# Patient Record
Sex: Female | Born: 1945 | Race: White | Hispanic: No | Marital: Married | Smoking: Never smoker
Health system: Southern US, Community
[De-identification: ages and names within clinical notes are randomized; demographics above are authoritative.]

## PROBLEM LIST (undated history)

## (undated) DIAGNOSIS — E538 Deficiency of other specified B group vitamins: Secondary | ICD-10-CM

## (undated) DIAGNOSIS — E063 Autoimmune thyroiditis: Secondary | ICD-10-CM

## (undated) DIAGNOSIS — D649 Anemia, unspecified: Secondary | ICD-10-CM

## (undated) DIAGNOSIS — E039 Hypothyroidism, unspecified: Secondary | ICD-10-CM

## (undated) DIAGNOSIS — E785 Hyperlipidemia, unspecified: Secondary | ICD-10-CM

## (undated) DIAGNOSIS — E041 Nontoxic single thyroid nodule: Secondary | ICD-10-CM

## (undated) HISTORY — PX: PARTIAL HYSTERECTOMY: SHX80

## (undated) HISTORY — DX: Hypothyroidism, unspecified: E03.9

## (undated) HISTORY — DX: Deficiency of other specified B group vitamins: E53.8

## (undated) HISTORY — DX: Hyperlipidemia, unspecified: E78.5

## (undated) HISTORY — DX: Anemia, unspecified: D64.9

## (undated) HISTORY — DX: Autoimmune thyroiditis: E06.3

## (undated) HISTORY — PX: TONSILLECTOMY: SUR1361

## (undated) HISTORY — DX: Nontoxic single thyroid nodule: E04.1

---

## 2009-11-26 ENCOUNTER — Encounter: Admission: RE | Admit: 2009-11-26 | Discharge: 2009-11-26 | Payer: Self-pay

## 2009-11-26 ENCOUNTER — Encounter: Admission: RE | Admit: 2009-11-26 | Discharge: 2009-11-26 | Payer: Self-pay | Admitting: Family Medicine

## 2011-01-08 ENCOUNTER — Encounter: Payer: Self-pay | Admitting: Internal Medicine

## 2011-02-24 IMAGING — US US RENAL
1 series · 14 of 25 positions shown · non-contrast
Comparison: None.

CLINICAL DATA: Family history of renal cell cancer.

RENAL/URINARY TRACT ULTRASOUND COMPLETE

[Series 1: us renal · 0.26mm/px · 14 of 29 slices shown]
[im 1/29]
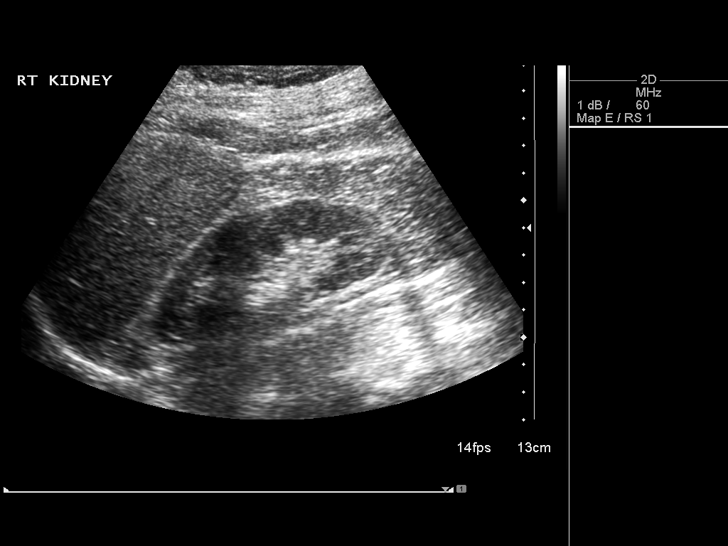
[im 3/29]
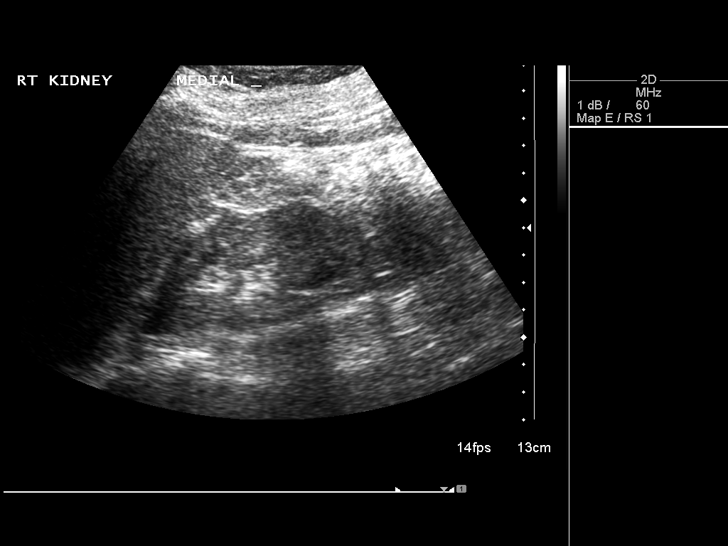
[im 5/29]
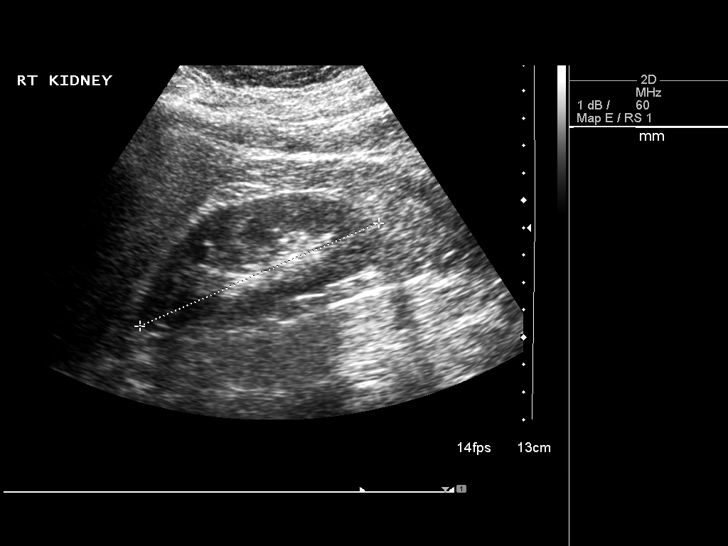
[im 8/29]
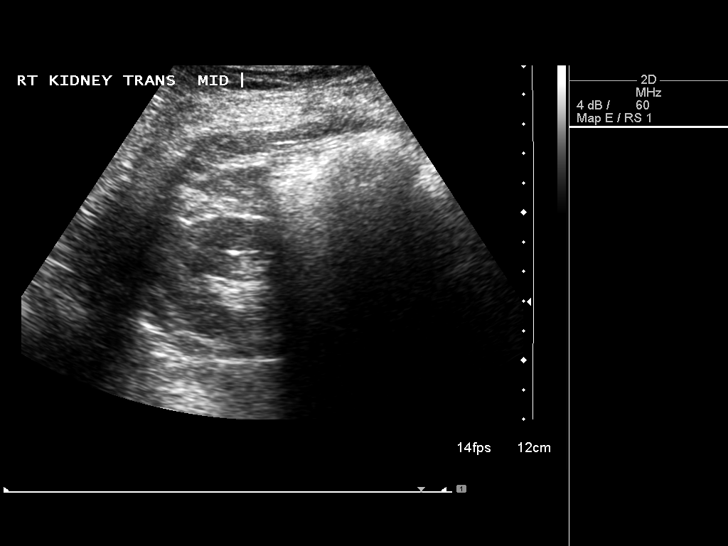
[im 10/29]
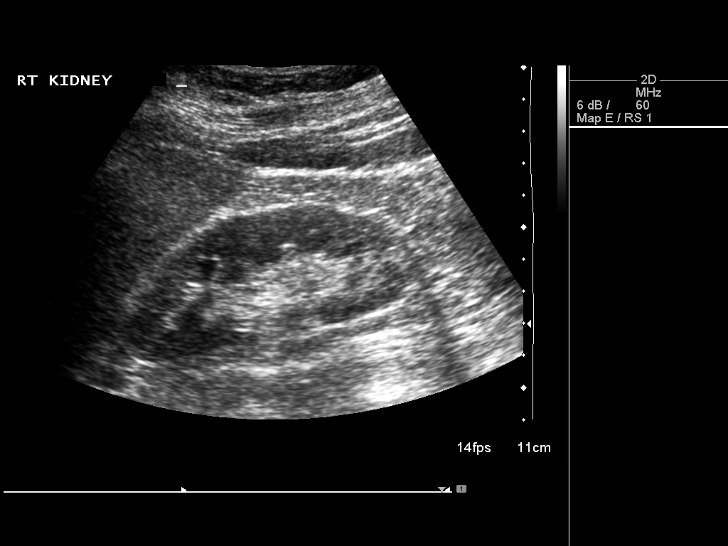
[im 11/29]
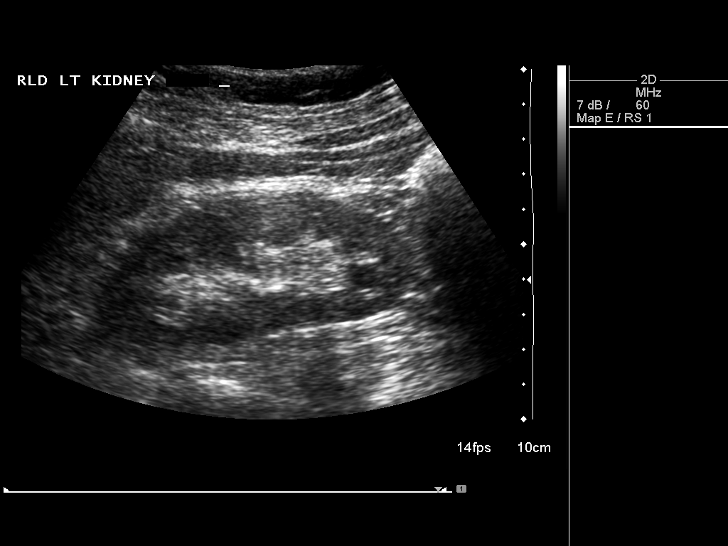
[im 13/29]
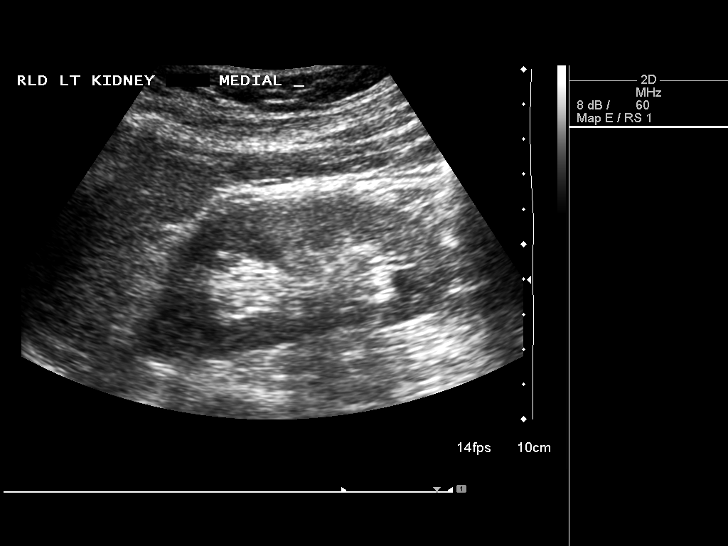
[im 16/29]
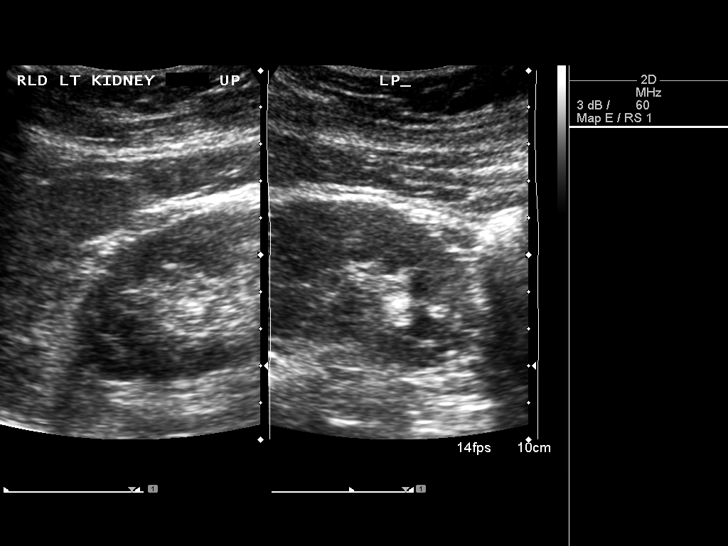
[im 18/29]
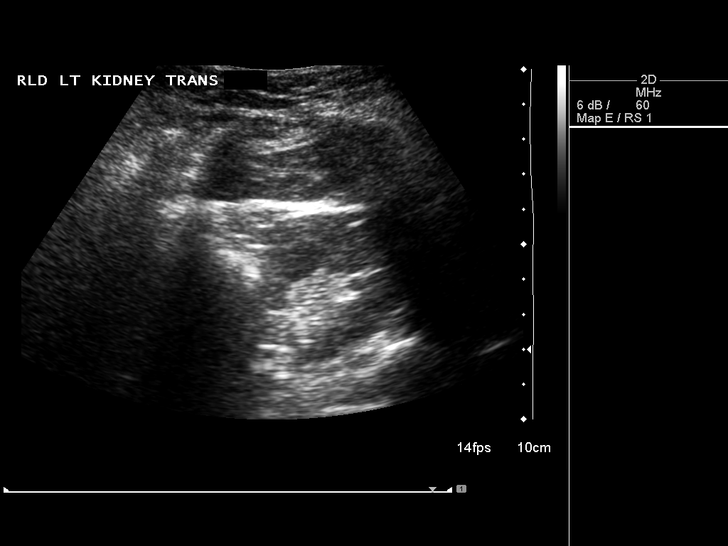
[im 19/29]
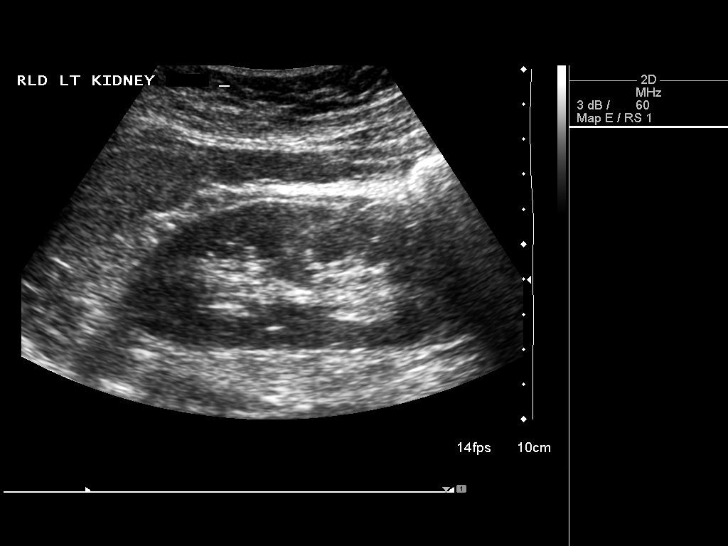
[im 22/29]
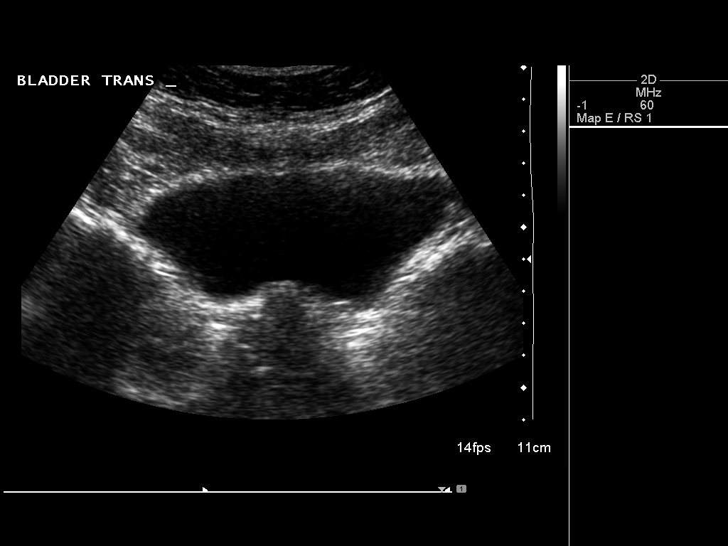
[im 24/29]
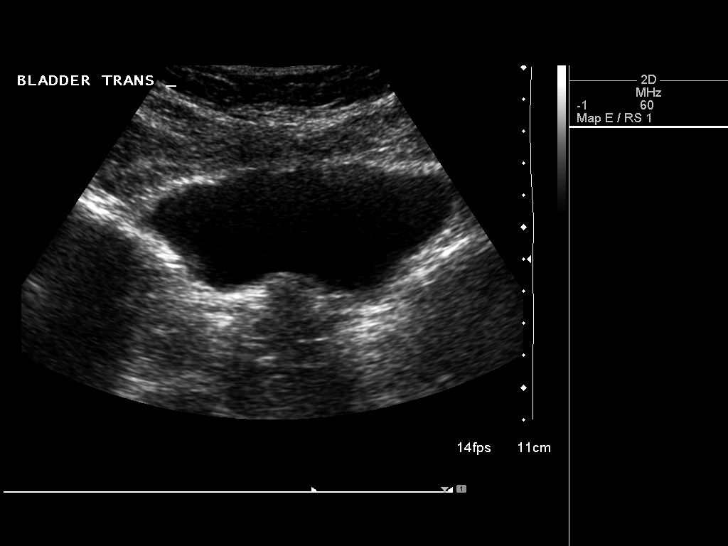
[im 26/29]
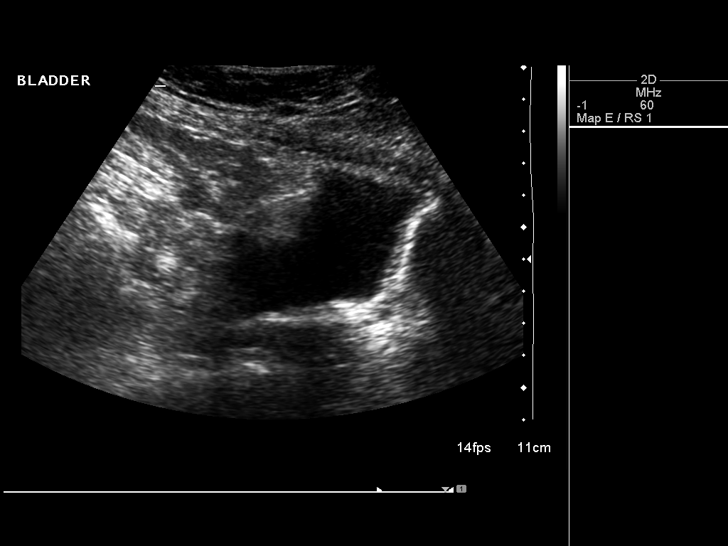
[im 29/29]
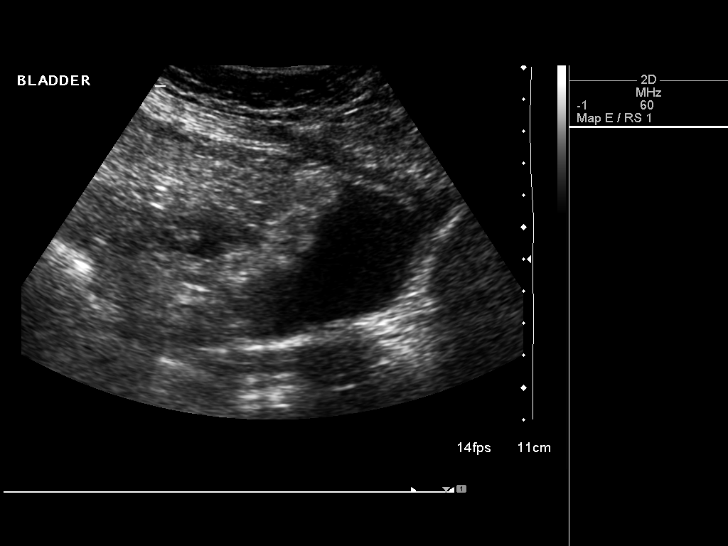

[14 of 25 positions shown; findings below may reference images not displayed]

FINDINGS: Right Kidney:  Right kidney measures 9.5 cm. Normal size and
echotexture.  No focal abnormality.  No hydronephrosis.

Left Kidney:  Left kidney measures 9.6 cm. Normal size and
echotexture.  No focal abnormality.  No hydronephrosis.

Bladder:  Normal for degree distention.
IMPRESSION: Unremarkable renal ultrasound.

## 2021-04-15 ENCOUNTER — Ambulatory Visit: Payer: Medicare Other | Admitting: Cardiology

## 2021-06-01 NOTE — Progress Notes (Signed)
Referring-Imran Hague MD Reason for referral-Pulmonary hypertension  HPI: 75 yo female for evaluation of pulmonary hypertension at request of Angelina Pih MD. Patient had a recent echocardiogram suggesting pulmonary hypertension by report.  I do not have those images available for review.  Cardiology was asked to evaluate.  Note she denies dyspnea, chest pain, palpitations or syncope.  There is no history of lung disease or sleep apnea.  Current Outpatient Medications  Medication Sig Dispense Refill   calcium-vitamin D (OSCAL WITH D) 500-200 MG-UNIT TABS tablet Take by mouth.     Coenzyme Q10 200 MG capsule 2 capsule with a meal     levothyroxine (SYNTHROID) 100 MCG tablet 1 TABLET IN THE MORNING ON AN EMPTY STOMACH     liothyronine (CYTOMEL) 5 MCG tablet Take 5 mcg by mouth daily.     omega-3 acid ethyl esters (LOVAZA) 1 g capsule Take by mouth 2 (two) times daily.     progesterone (PROMETRIUM) 200 MG capsule 1 capsule at bedtime     pyridoxine (B-6) 100 MG tablet 2 tablet     selenium 200 MCG TABS tablet 1 tablet     No current facility-administered medications for this visit.    No Known Allergies   Past Medical History:  Diagnosis Date   Anemia    Hashimoto's disease    Hyperlipidemia    Hypothyroidism    Thyroid nodule    Vitamin B12 deficiency     Past Surgical History:  Procedure Laterality Date   PARTIAL HYSTERECTOMY Bilateral    TONSILLECTOMY      Social History   Socioeconomic History   Marital status: Married    Spouse name: Not on file   Number of children: 1   Years of education: Not on file   Highest education level: Not on file  Occupational History   Not on file  Tobacco Use   Smoking status: Never   Smokeless tobacco: Never  Substance and Sexual Activity   Alcohol use: Never   Drug use: Not on file   Sexual activity: Not on file  Other Topics Concern   Not on file  Social History Narrative   Not on file   Social Determinants of Health    Financial Resource Strain: Not on file  Food Insecurity: Not on file  Transportation Needs: Not on file  Physical Activity: Not on file  Stress: Not on file  Social Connections: Not on file  Intimate Partner Violence: Not on file    Family History  Problem Relation Age of Onset   Hypertension Mother    Hypertension Father    CAD Brother     ROS: no fevers or chills, productive cough, hemoptysis, dysphasia, odynophagia, melena, hematochezia, dysuria, hematuria, rash, seizure activity, orthopnea, PND, pedal edema, claudication. Remaining systems are negative.  Physical Exam:   Blood pressure (!) 172/80, pulse 73, height 5\' 3"  (1.6 m), weight 146 lb 9.6 oz (66.5 kg).  General:  Well developed/well nourished in NAD Skin warm/dry Patient not depressed No peripheral clubbing Back-normal HEENT-normal/normal eyelids Neck supple/normal carotid upstroke bilaterally; no bruits; no JVD; no thyromegaly chest - CTA/ normal expansion CV - RRR/normal S1 and S2; no murmurs, rubs or gallops;  PMI nondisplaced Abdomen -NT/ND, no HSM, no mass, + bowel sounds, no bruit 2+ femoral pulses, no bruits Ext-no edema, chords, 2+ DP Neuro-grossly nonfocal  ECG -normal sinus rhythm, normal axis, no significant ST changes.  Personally reviewed  A/P  1 Pulmonary hypertension-patient has no  symptoms at this point.  I will obtain echo images and report from [facility] for review and make further recommendations at that point.  Note she has no history of lung disease or sleep apnea.  No history of congestive heart failure or valvular heart disease by report.  2 family history of coronary artery disease-patient's brother died of a myocardial infarction at age 62.  I will arrange a calcium score for risk stratification.  3 hyperlipidemia-if calcium score elevated will add statin.  Olga Millers, MD

## 2021-06-10 ENCOUNTER — Encounter: Payer: Self-pay | Admitting: Cardiology

## 2021-06-10 ENCOUNTER — Other Ambulatory Visit: Payer: Self-pay

## 2021-06-10 ENCOUNTER — Ambulatory Visit: Payer: Medicare Other | Admitting: Cardiology

## 2021-06-10 VITALS — BP 172/80 | HR 73 | Ht 63.0 in | Wt 146.6 lb

## 2021-06-10 DIAGNOSIS — Z136 Encounter for screening for cardiovascular disorders: Secondary | ICD-10-CM

## 2021-06-10 DIAGNOSIS — I272 Pulmonary hypertension, unspecified: Secondary | ICD-10-CM | POA: Diagnosis not present

## 2021-06-10 NOTE — Patient Instructions (Signed)
  Testing/Procedures:  CORONARY CT CALCIUM SCORING AT [address]   Follow-Up: At [facility], you and your health needs are our priority.  As part of our continuing mission to provide you with exceptional heart care, we have created designated Provider Care Teams.  These Care Teams include your primary Cardiologist (physician) and Advanced Practice Providers (APPs -  Physician Assistants and Nurse Practitioners) who all work together to provide you with the care you need, when you need it.  We recommend signing up for the patient portal called "MyChart".  Sign up information is provided on this After Visit Summary.  MyChart is used to connect with patients for Virtual Visits (Telemedicine).  Patients are able to view lab/test results, encounter notes, upcoming appointments, etc.  Non-urgent messages can be sent to your provider as well.   To learn more about what you can do with MyChart, go to [email or web address].    Your next appointment:   6 month(s)  The format for your next appointment:   In Person  Provider:   Olga Millers, MD

## 2021-06-27 ENCOUNTER — Encounter: Payer: Self-pay | Admitting: *Deleted

## 2021-07-15 ENCOUNTER — Other Ambulatory Visit: Payer: Self-pay

## 2021-07-15 ENCOUNTER — Ambulatory Visit (INDEPENDENT_AMBULATORY_CARE_PROVIDER_SITE_OTHER)
Admission: RE | Admit: 2021-07-15 | Discharge: 2021-07-15 | Disposition: A | Discharge status: Home / Self Care | Payer: Self-pay | Source: Ambulatory Visit | Attending: Cardiology | Admitting: Cardiology

## 2021-07-15 DIAGNOSIS — Z136 Encounter for screening for cardiovascular disorders: Secondary | ICD-10-CM

## 2021-11-28 NOTE — Progress Notes (Signed)
HPI: Follow-up pulmonary hypertension. Echocardiogram performed in [place] in January 2022 showed normal LV function, normal diastolic dysfunction, mild aortic, mitral, tricuspid and pulmonic insufficiency and mild pulmonary hypertension with estimated RV systolic pressure of 44.  I do not have those images available for review.  Calcium score July 2022 0.  2 small pulmonary nodules noted and no follow-up recommended if patient low risk left follow-up in 12 months if higher risk.  Since last seen, she denies dyspnea, chest pain, palpitations or syncope.  Current Outpatient Medications  Medication Sig Dispense Refill   calcium-vitamin D (OSCAL WITH D) 500-200 MG-UNIT TABS tablet Take by mouth.     Coenzyme Q10 200 MG capsule 2 capsule with a meal     Cyanocobalamin (VITAMIN B 12) 500 MCG TABS 1 tablet     levothyroxine (SYNTHROID) 88 MCG tablet TAKE 1 TABLET IN THE MORNING ON AN EMPTY STOMACH ORALLY ONCE A DAY 90 DAY(S)     liothyronine (CYTOMEL) 5 MCG tablet Take 5 mcg by mouth daily.     Misc Natural Products (COLON CLEANSE) CAPS See admin instructions.     omega-3 acid ethyl esters (LOVAZA) 1 g capsule Take by mouth 2 (two) times daily.     progesterone (PROMETRIUM) 200 MG capsule 1 capsule at bedtime     PROGESTERONE MICRONIZED PO 1 capsule at bedtime     pyridoxine (B-6) 100 MG tablet 2 tablet     selenium 200 MCG TABS tablet 1 tablet     levothyroxine (SYNTHROID) 100 MCG tablet 1 TABLET IN THE MORNING ON AN EMPTY STOMACH (Patient not taking: Reported on 12/02/2021)     No current facility-administered medications for this visit.     Past Medical History:  Diagnosis Date   Anemia    Hashimoto's disease    Hyperlipidemia    Hypothyroidism    Thyroid nodule    Vitamin B12 deficiency     Past Surgical History:  Procedure Laterality Date   PARTIAL HYSTERECTOMY Bilateral    TONSILLECTOMY      Social History   Socioeconomic History   Marital status: Married    Spouse  name: Not on file   Number of children: 1   Years of education: Not on file   Highest education level: Not on file  Occupational History   Not on file  Tobacco Use   Smoking status: Never   Smokeless tobacco: Never  Substance and Sexual Activity   Alcohol use: Never   Drug use: Not on file   Sexual activity: Not on file  Other Topics Concern   Not on file  Social History Narrative   Not on file   Social Determinants of Health   Financial Resource Strain: Not on file  Food Insecurity: Not on file  Transportation Needs: Not on file  Physical Activity: Not on file  Stress: Not on file  Social Connections: Not on file  Intimate Partner Violence: Not on file    Family History  Problem Relation Age of Onset   Hypertension Mother    Hypertension Father    CAD Brother     ROS: no fevers or chills, productive cough, hemoptysis, dysphasia, odynophagia, melena, hematochezia, dysuria, hematuria, rash, seizure activity, orthopnea, PND, pedal edema, claudication. Remaining systems are negative.  Physical Exam: Well-developed well-nourished in no acute distress.  Skin is warm and dry.  HEENT is normal.  Neck is supple.  Chest is clear to auscultation with normal expansion.  Cardiovascular exam  is regular rate and rhythm.  Abdominal exam nontender or distended. No masses palpated. Extremities show no edema. neuro grossly intact  A/P  1 pulmonary hypertension-patient remains asymptomatic.  By report pulmonary hypertension was mild.  Will not pursue further evaluation.  2 pulmonary nodules-she will need follow-up noncontrast chest CT July 2023.  3 hyperlipidemia-continue diet.  Note calcium score was 0.  4 elevated blood pressure reading-no diagnosis of hypertension previously.  I have asked her to follow this and we will initiate medications if needed.  Olga Millers, MD

## 2021-12-02 ENCOUNTER — Other Ambulatory Visit: Payer: Self-pay

## 2021-12-02 ENCOUNTER — Encounter: Payer: Self-pay | Admitting: Cardiology

## 2021-12-02 ENCOUNTER — Ambulatory Visit: Payer: Medicare Other | Admitting: Cardiology

## 2021-12-02 VITALS — BP 150/72 | HR 84 | Ht 63.0 in | Wt 150.6 lb

## 2021-12-02 DIAGNOSIS — I272 Pulmonary hypertension, unspecified: Secondary | ICD-10-CM | POA: Diagnosis not present

## 2021-12-02 DIAGNOSIS — R030 Elevated blood-pressure reading, without diagnosis of hypertension: Secondary | ICD-10-CM | POA: Diagnosis not present

## 2021-12-02 DIAGNOSIS — E7800 Pure hypercholesterolemia, unspecified: Secondary | ICD-10-CM | POA: Diagnosis not present

## 2021-12-02 NOTE — Patient Instructions (Signed)

## 2022-05-29 ENCOUNTER — Other Ambulatory Visit (HOSPITAL_BASED_OUTPATIENT_CLINIC_OR_DEPARTMENT_OTHER): Payer: Self-pay | Admitting: *Deleted

## 2022-05-29 DIAGNOSIS — R918 Other nonspecific abnormal finding of lung field: Secondary | ICD-10-CM

## 2022-05-31 ENCOUNTER — Telehealth (HOSPITAL_BASED_OUTPATIENT_CLINIC_OR_DEPARTMENT_OTHER): Payer: Self-pay | Admitting: Cardiology

## 2022-05-31 NOTE — Telephone Encounter (Signed)
Left message for patient to call and discuss scheduling the CT chest ordered by Dr. Crenshaw 

## 2022-06-30 ENCOUNTER — Ambulatory Visit
Admission: RE | Admit: 2022-06-30 | Discharge: 2022-06-30 | Disposition: A | Discharge status: Home / Self Care | Payer: Medicare Other | Source: Ambulatory Visit | Attending: Cardiology | Admitting: Cardiology

## 2022-06-30 DIAGNOSIS — R918 Other nonspecific abnormal finding of lung field: Secondary | ICD-10-CM

## 2022-10-13 IMAGING — CT CT CARDIAC CORONARY ARTERY CALCIUM SCORE
3 series · 14 of 20 positions shown, 15 images · non-contrast
Comparison: None.
COMPARISON: None.

Addendum:
EXAM:
OVER-READ INTERPRETATION  CT CHEST

The following report is an over-read performed by radiologist Dr.
Chai Tiger [[email or web address]] on 07/15/2021. This over-read
does not include interpretation of cardiac or coronary anatomy or
pathology. The coronary calcium score interpretation by the
cardiologist is attached.
CLINICAL DATA: Cardiovascular Disease Risk stratification
Coronary Calcium Score
TECHNIQUE: A gated, non-contrast computed tomography scan of the heart was
performed using 3mm slice thickness. Axial images were analyzed on a
dedicated workstation. Calcium scoring of the coronary arteries was
performed using the Agatston method.

[Series 2: casc 3.0 bv41 2 bestdiast 73 % · axial · 0.42mm/px · z∈[-192,-120]mm · 4 of 42 slices shown, 5 images]
[im 9/42  vessel]
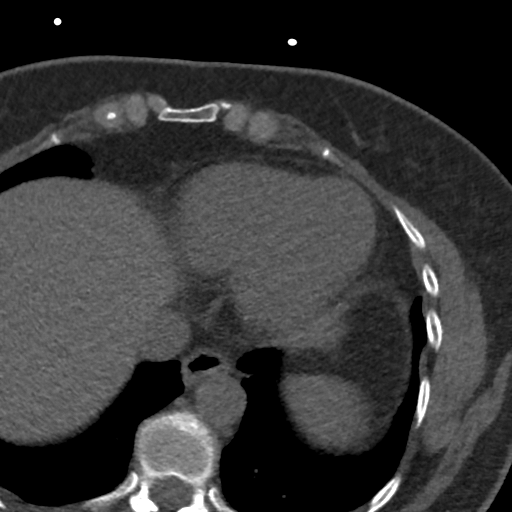
[im 9/42  lung]
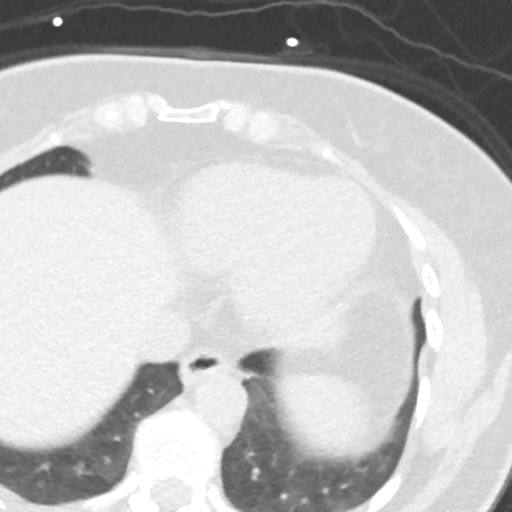
[im 17/42  vessel]
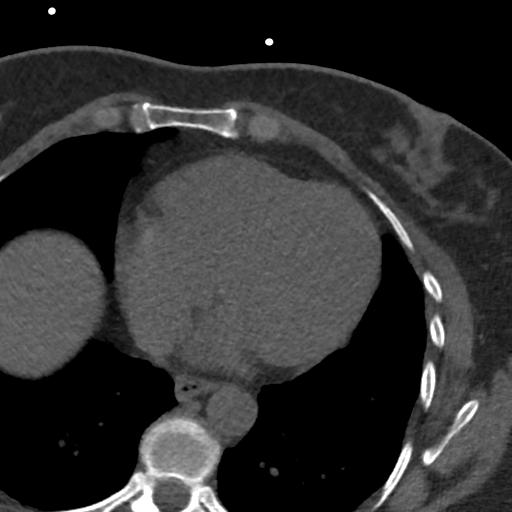
[im 25/42  vessel]
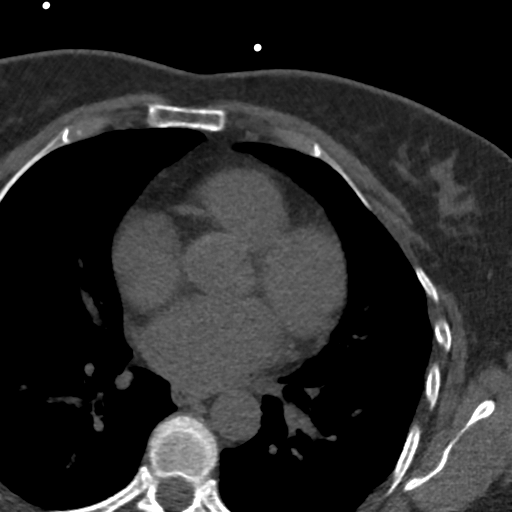
[im 33/42  vessel]
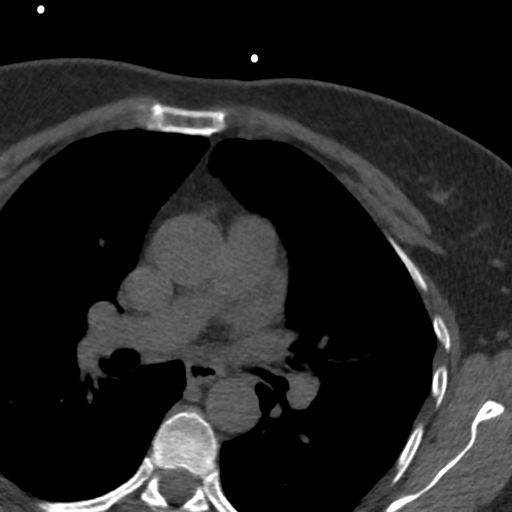

[Series 3: lung 72 % · axial · 0.57mm/px · z∈[-198,-114]mm · 5 of 42 slices shown]
[im 7/42  lung]
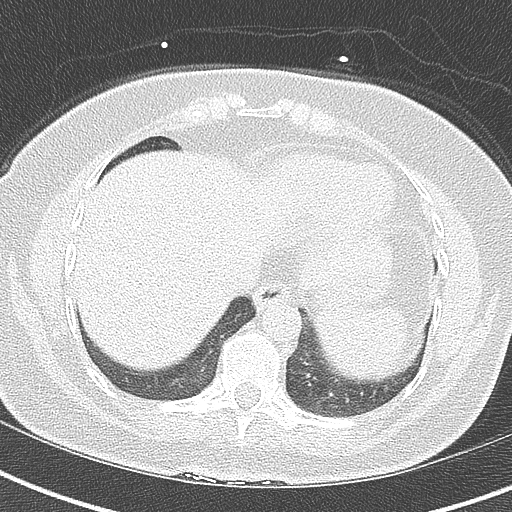
[im 14/42  lung]
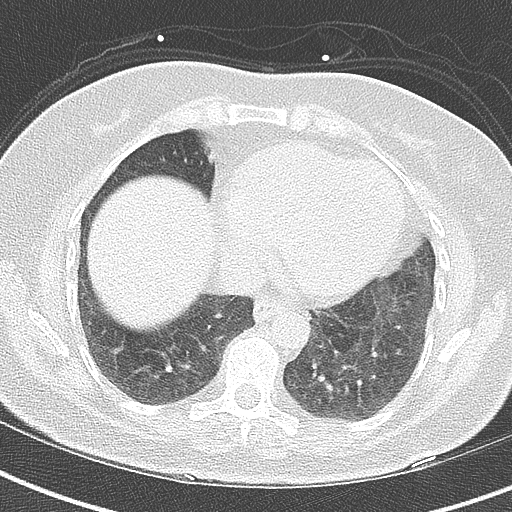
[im 21/42  lung]
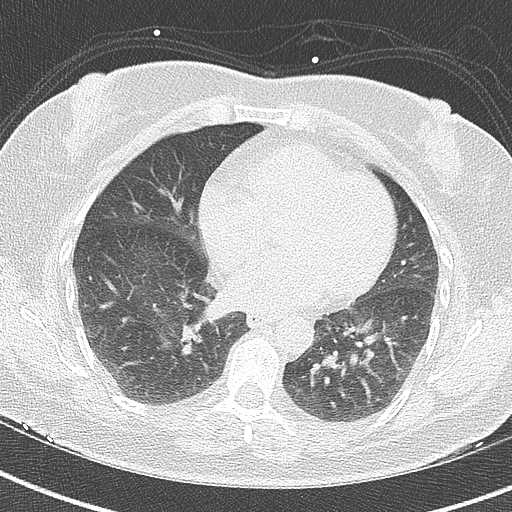
[im 28/42  lung]
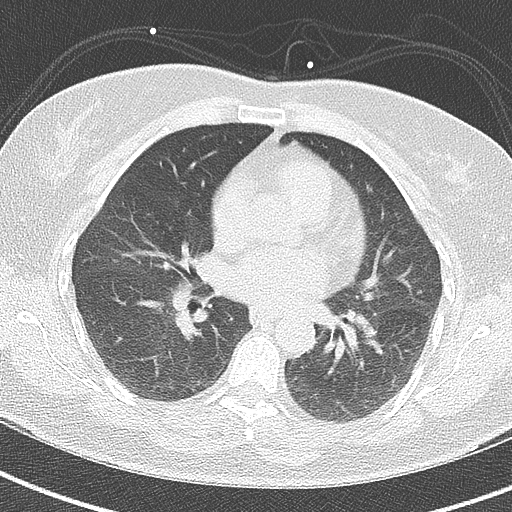
[im 35/42  lung]
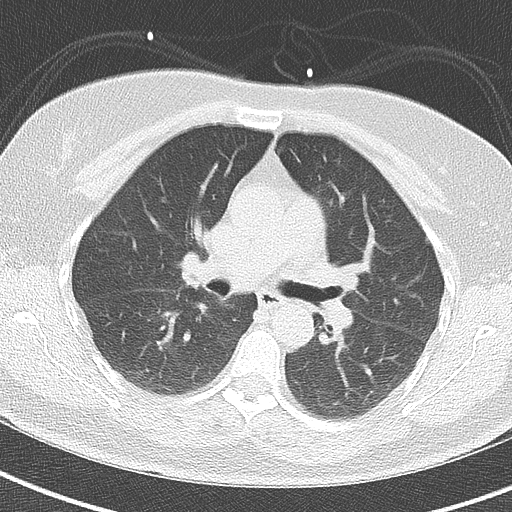

[Series 4: lung st 72 % · axial · 0.57mm/px · z∈[-198,-114]mm · 5 of 42 slices shown]
[im 7/42  lung]
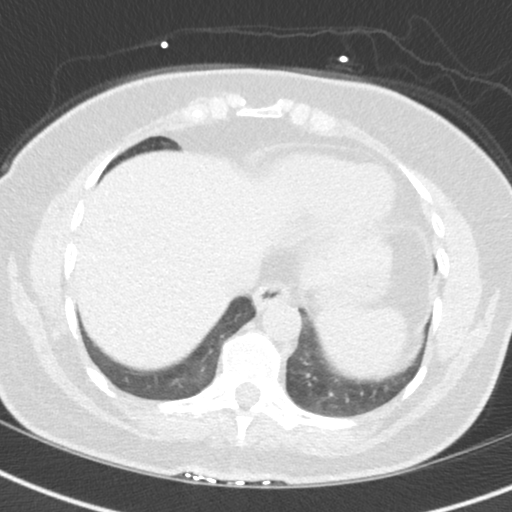
[im 14/42  lung]
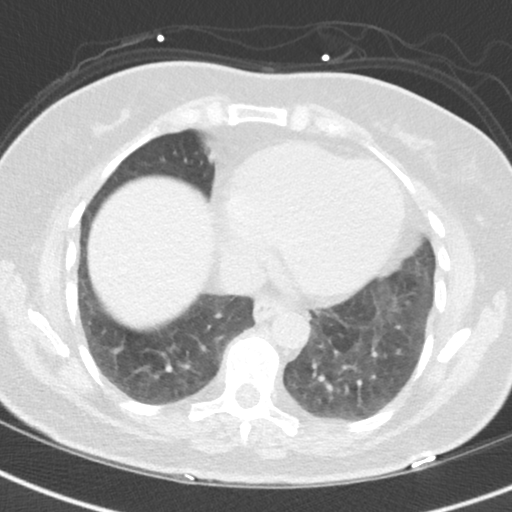
[im 21/42  lung]
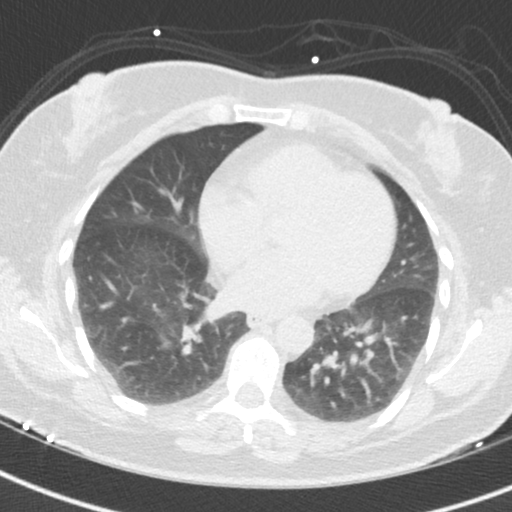
[im 28/42  lung]
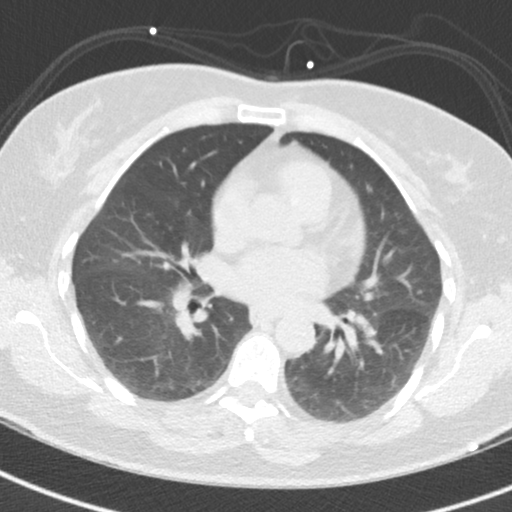
[im 35/42  lung]
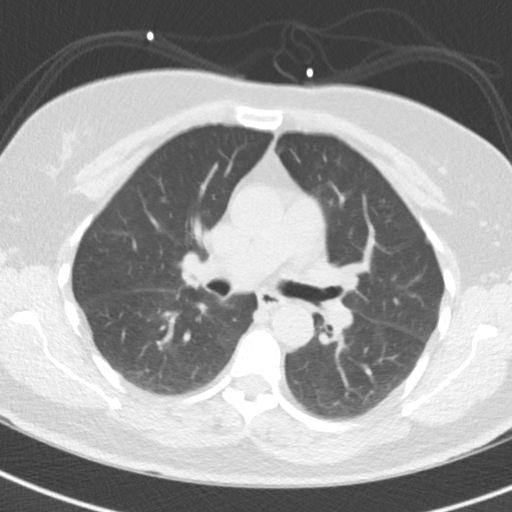

[14 of 20 positions shown; findings below may reference images not displayed]

FINDINGS: Vascular: Normal caliber of the visualized thoracic aorta.

Mediastinum/Nodes: Visualized mediastinal structures are normal.

Lungs/Pleura: Motion artifact limits evaluation of the visualized
lungs. There is no significant airspace disease or consolidation in
the lungs. No large pleural effusions. Pleural-based nodule in the
left upper lobe measures 3 mm on sequence 3, image 9. Question a 5
mm nodule in the right middle lobe on sequence 3, image 20.

Upper Abdomen: Visualized upper abdomen is unremarkable.

Musculoskeletal: No acute bone abnormality.
IMPRESSION: 1. No acute abnormality involving the extracardiac structures.
2. Two small pulmonary nodules. No follow-up needed if patient is
low-risk (and has no known or suspected primary neoplasm).
Non-contrast chest CT can be considered in 12 months if patient is
high-risk. This recommendation follows the consensus statement:
Guidelines for Management of Incidental Pulmonary Nodules Detected
FINDINGS: Coronary arteries: Normal origins.

Coronary Calcium Score:

Left main: 0

Left anterior descending artery: 0

Left circumflex artery: 0

Right coronary artery: 0

Total: 0

Percentile: 0

Pericardium: Normal.

Ascending Aorta: Normal caliber.

Non-cardiac: See separate report from [REDACTED].
IMPRESSION: Coronary calcium score of 0. This is a low risk study.



If CAC=0, it is reasonable to withhold statin therapy and reassess
in 5 to 10 years, as long as higher risk conditions are absent
(diabetes mellitus, family history of premature CHD in first degree
relatives (males <55 years; females <65 years), cigarette smoking,
or LDL >=190 mg/dL).

If CAC is 1 to 99, it is reasonable to initiate statin therapy for
patients >=55 years of age.

If CAC is >=100 or >=75th percentile, it is reasonable to initiate
statin therapy at any age.

Cardiology referral should be considered for patients with CAC
scores >=400 or >=75th percentile.

*1673 AHA/ACC/AACVPR/AAPA/ABC/PARK/JOSHJAX/OREN/Lu[place]aa/MAMOCAN/VAROUNEN/LABELLE SALHA
Guideline on the Management of Blood Cholesterol: A Report of the
American College of Cardiology/American Heart Association Task Force
on Clinical Practice Guidelines. J Am Coll Cardiol.
5100;73(24):6981-6356.

*** End of Addendum ***
EXAM:
OVER-READ INTERPRETATION  CT CHEST

The following report is an over-read performed by radiologist Dr.
Chai Tiger [[email or web address]] on 07/15/2021. This over-read
does not include interpretation of cardiac or coronary anatomy or
pathology. The coronary calcium score interpretation by the
cardiologist is attached.
FINDINGS: Vascular: Normal caliber of the visualized thoracic aorta.

Mediastinum/Nodes: Visualized mediastinal structures are normal.

Lungs/Pleura: Motion artifact limits evaluation of the visualized
lungs. There is no significant airspace disease or consolidation in
the lungs. No large pleural effusions. Pleural-based nodule in the
left upper lobe measures 3 mm on sequence 3, image 9. Question a 5
mm nodule in the right middle lobe on sequence 3, image 20.

Upper Abdomen: Visualized upper abdomen is unremarkable.

Musculoskeletal: No acute bone abnormality.
IMPRESSION: 1. No acute abnormality involving the extracardiac structures.
2. Two small pulmonary nodules. No follow-up needed if patient is
low-risk (and has no known or suspected primary neoplasm).
Non-contrast chest CT can be considered in 12 months if patient is
high-risk. This recommendation follows the consensus statement:
Guidelines for Management of Incidental Pulmonary Nodules Detected
